# Patient Record
Sex: Male | Born: 1990 | Smoking: Current every day smoker
Health system: Southern US, Community
[De-identification: ages and names within clinical notes are randomized; demographics above are authoritative.]

## PROBLEM LIST (undated history)

## (undated) DIAGNOSIS — Z789 Other specified health status: Secondary | ICD-10-CM

---

## 2020-12-09 ENCOUNTER — Other Ambulatory Visit: Payer: Self-pay

## 2020-12-09 ENCOUNTER — Emergency Department (HOSPITAL_COMMUNITY): Payer: No Typology Code available for payment source

## 2020-12-09 ENCOUNTER — Emergency Department (HOSPITAL_COMMUNITY)
Admission: EM | Admit: 2020-12-09 | Discharge: 2020-12-10 | Disposition: A | Discharge status: Home / Self Care | Payer: No Typology Code available for payment source | Attending: Emergency Medicine | Admitting: Emergency Medicine

## 2020-12-09 ENCOUNTER — Encounter (HOSPITAL_COMMUNITY): Payer: Self-pay

## 2020-12-09 DIAGNOSIS — Z5321 Procedure and treatment not carried out due to patient leaving prior to being seen by health care provider: Secondary | ICD-10-CM | POA: Diagnosis not present

## 2020-12-09 DIAGNOSIS — M25511 Pain in right shoulder: Secondary | ICD-10-CM | POA: Insufficient documentation

## 2020-12-09 DIAGNOSIS — M542 Cervicalgia: Secondary | ICD-10-CM | POA: Diagnosis not present

## 2020-12-09 DIAGNOSIS — Y92410 Unspecified street and highway as the place of occurrence of the external cause: Secondary | ICD-10-CM | POA: Insufficient documentation

## 2020-12-09 DIAGNOSIS — H5789 Other specified disorders of eye and adnexa: Secondary | ICD-10-CM | POA: Insufficient documentation

## 2020-12-09 HISTORY — DX: Other specified health status: Z78.9
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
## 2021-10-01 DIAGNOSIS — Z202 Contact with and (suspected) exposure to infections with a predominantly sexual mode of transmission: Secondary | ICD-10-CM

## 2021-10-02 ENCOUNTER — Inpatient Hospital Stay
Admit: 2021-10-02 | Discharge: 2021-10-02 | Disposition: A | Discharge status: Home / Self Care | Attending: Emergency Medicine

## 2021-10-02 DIAGNOSIS — Z7689 Persons encountering health services in other specified circumstances: Secondary | ICD-10-CM

## 2021-10-02 MED ORDER — AZITHROMYCIN 250 MG TAB
250 mg | ORAL | Status: AC
Start: 2021-10-02 — End: 2021-10-02
  Administered 2021-10-02: 09:00:00 via ORAL

## 2021-10-02 MED ORDER — METRONIDAZOLE 250 MG TAB
250 mg | ORAL | Status: AC
Start: 2021-10-02 — End: 2021-10-02
  Administered 2021-10-02: 09:00:00 via ORAL

## 2021-10-02 MED ORDER — CEFTRIAXONE 500 MG SOLUTION FOR INJECTION
500 mg | Freq: Once | INTRAMUSCULAR | Status: AC
Start: 2021-10-02 — End: 2021-10-02
  Administered 2021-10-02: 09:00:00 via INTRAMUSCULAR

## 2021-10-02 MED FILL — CEFTRIAXONE 500 MG SOLUTION FOR INJECTION: 500 mg | INTRAMUSCULAR | Qty: 500

## 2021-10-02 MED FILL — AZITHROMYCIN 250 MG TAB: 250 mg | ORAL | Qty: 4

## 2021-10-02 MED FILL — METRONIDAZOLE 250 MG TAB: 250 mg | ORAL | Qty: 8

## 2021-10-02 NOTE — ED Provider Notes (Signed)
EMERGENCY DEPARTMENT HISTORY AND PHYSICAL EXAM    Date: 10/02/2021  Patient Name: Dustin Kirk    History of Presenting Illness     Chief Complaint   Patient presents with    Exposure to STD         History Provided By: Primary patient    Additional History (Context):   3:54 AM  Dustin Kirk is a 31 y.o. male with PMHX of no medical history who presents to the emergency department C/O STD exposure.  He presents asking for treatment for STDs after his male partner states she has chlamydia and gonorrhea.  Patient is completely asymptomatic without lesions or dysuria.  He just got the notice today.    Social History  Patient does smoke      Family History  No immunocompromise      PCP: None        Past History     Past Medical History:  No past medical history on file.    Past Surgical History:  No past surgical history on file.    Family History:  No family history on file.    Social History:       Allergies:  No Known Allergies      Review of Systems   Review of Systems  He is completely asymptomatic    Physical Exam     Vitals:    10/02/21 0019 10/02/21 0021 10/02/21 0355 10/02/21 0438   BP: 126/75  121/83 137/88   Pulse: 75  84 81   Resp: 18  17 16    Temp: 98.1 ??F (36.7 ??C)      SpO2: 98%  100%    Weight:  65.8 kg (145 lb)       Physical Exam  Vitals and nursing note reviewed.   Constitutional:       General: He is not in acute distress.     Appearance: He is well-developed. He is not diaphoretic.   HENT:      Head: Normocephalic and atraumatic.   Eyes:      General: No scleral icterus.     Extraocular Movements:      Right eye: Normal extraocular motion.      Left eye: Normal extraocular motion.      Conjunctiva/sclera: Conjunctivae normal.      Pupils: Pupils are equal, round, and reactive to light.   Neck:      Trachea: No tracheal deviation.   Cardiovascular:      Rate and Rhythm: Normal rate.   Pulmonary:      Effort: Pulmonary effort is normal. No respiratory distress.      Breath sounds: No  stridor.   Genitourinary:     Comments: Deferred per patient request  Musculoskeletal:         General: No tenderness. Normal range of motion.      Cervical back: Normal range of motion and neck supple.      Comments: Grossly unremarkable without abnormalities   Skin:     General: Skin is warm and dry.      Capillary Refill: Capillary refill takes less than 2 seconds.      Findings: No erythema or rash.   Neurological:      Mental Status: He is alert and oriented to person, place, and time.      GCS: GCS eye subscore is 4. GCS verbal subscore is 5. GCS motor subscore is 6.      Cranial Nerves: No  cranial nerve deficit.      Motor: No weakness.   Psychiatric:         Attention and Perception: Attention normal.         Mood and Affect: Mood normal.         Speech: Speech normal.         Behavior: Behavior normal.         Thought Content: Thought content normal.         Judgment: Judgment normal.     Diagnostic Study Results     Labs -  No results found for this or any previous visit (from the past 24 hour(s)).     Radiologic Studies -   No orders to display     CT Results  (Last 48 hours)      None          CXR Results  (Last 48 hours)      None            Medications given in the ED-  Medications   cefTRIAXone (ROCEPHIN) 500 mg in lidocaine (PF) (XYLOCAINE) 10 mg/mL (1 %) IM injection (500 mg IntraMUSCular Given 10/02/21 0423)   azithromycin (ZITHROMAX) tablet 1,000 mg (1,000 mg Oral Given 10/02/21 0422)   metroNIDAZOLE (FLAGYL) tablet 2,000 mg (2,000 mg Oral Given 10/02/21 0422)         Medical Decision Making   I am the first provider for this patient.    I reviewed the vital signs, available nursing notes, past medical history, past surgical history, family history and social history.    Vital Signs-Reviewed the patient's vital signs.    Pulse Oximetry Analysis - 100% on room air    Records Reviewed: NURSING NOTES AND PREVIOUS MEDICAL RECORDS    Provider Notes (Medical Decision Making):   We discussed treatment and he  does not want testing he only wants antibiotics for his infection.  We recommended outpatient referral to test for HIV syphilis etc.  I offered to examine his skin carefully however he politely declined.  He will be discharged with health department referral.  It is possible he has insidious underlying HIV syphilis or other infections and he understands this.    Procedures:  Procedures    ED Course:   4:54 AM: Initial assessment performed. The patients presenting problems have been discussed, and they are in agreement with the care plan formulated and outlined with them.  I have encouraged them to ask questions as they arise throughout their visit.    Diagnosis and Disposition       DISCHARGE NOTE:  4:20 AM  Dustin Kirk's  results have been reviewed with him.  He has been counseled regarding his diagnosis, treatment, and plan.  He verbally conveys understanding and agreement of the signs, symptoms, diagnosis, treatment and prognosis and additionally agrees to follow up as discussed.  He also agrees with the care-plan and conveys that all of his questions have been answered.  I have also provided discharge instructions for him that include: educational information regarding their diagnosis and treatment, and list of reasons why they would want to return to the ED prior to their follow-up appointment, should his condition change. He has been provided with education for proper emergency department utilization.     CLINICAL IMPRESSION:    1. Encounter for assessment of STD exposure        PLAN:  1. D/C Home  2. There are no discharge medications for this  patient.    3.   Follow-up Information       Follow up With Specialties Details Why Contact Info    [facility] HEALTH DEPARTMENT    [address]  [address] [place] [address]  [phone]          _______________________________    This note was partially transcribed via voice recognition software. Although efforts have been made to catch any  discrepancies, it may contain sound alike words, grammatical errors, or nonsensical words.

## 2021-10-02 NOTE — ED Notes (Signed)
Pt in for exposure to STDs.

## 2021-10-02 NOTE — ED Notes (Signed)
Patient presents with above complaint , just want to be treated for std denies any penile discharge

## 2021-10-02 NOTE — ED Notes (Signed)
Patient revaluated after medication given no reaction noted then discharge home. To follow up with pmd. Instructions given

## 2021-11-01 ENCOUNTER — Inpatient Hospital Stay
Admit: 2021-11-01 | Discharge: 2021-11-01 | Disposition: A | Discharge status: Home / Self Care | Attending: Emergency Medicine

## 2021-11-01 DIAGNOSIS — Z202 Contact with and (suspected) exposure to infections with a predominantly sexual mode of transmission: Secondary | ICD-10-CM

## 2021-11-01 LAB — URINALYSIS W/ RFLX MICROSCOPIC
Bilirubin, Urine: NEGATIVE
Bilirubin: NEGATIVE
Blood, Urine: NEGATIVE
Blood: NEGATIVE
Glucose, Ur: NEGATIVE mg/dL
Glucose: NEGATIVE mg/dL
Ketone: NEGATIVE mg/dL
Ketones, Urine: NEGATIVE mg/dL
Leukocyte Esterase, Urine: NEGATIVE
Leukocyte Esterase: NEGATIVE
Nitrite, Urine: NEGATIVE
Nitrites: NEGATIVE
Protein, UA: NEGATIVE mg/dL
Protein: NEGATIVE mg/dL
Specific Gravity, UA: 1.006 (ref 1.005–1.030)
Specific gravity: 1.006 (ref 1.005–1.030)
Urobilinogen, UA, POCT: 0.2 EU/dL (ref 0.2–1.0)
Urobilinogen: 0.2 EU/dL (ref 0.2–1.0)
pH (UA): 6.5 (ref 5.0–8.0)
pH, UA: 6.5 (ref 5.0–8.0)

## 2021-11-01 LAB — CHLAMYDIA/NEISSERIA AMPLIFICATION
Chlamydia amplification: NEGATIVE
N. gonorrhoeae amplification: NEGATIVE

## 2021-11-01 LAB — C.TRACHOMATIS N.GONORRHOEAE DNA
Chlamydia trachomatis, NAA: NEGATIVE
Neisseria Gonorrhoeae, NAA: NEGATIVE

## 2021-11-01 MED ORDER — CEFTRIAXONE 500 MG SOLUTION FOR INJECTION
500 mg | INTRAMUSCULAR | Status: AC
Start: 2021-11-01 — End: 2021-11-01
  Administered 2021-11-01: 13:00:00 via INTRAMUSCULAR

## 2021-11-01 MED ORDER — DOXYCYCLINE HYCLATE 100 MG TAB
100 mg | ORAL_TABLET | Freq: Two times a day (BID) | ORAL | 0 refills | Status: AC
Start: 2021-11-01 — End: 2021-11-11

## 2021-11-01 MED FILL — CEFTRIAXONE 500 MG SOLUTION FOR INJECTION: 500 mg | INTRAMUSCULAR | Qty: 500

## 2021-11-01 NOTE — ED Notes (Signed)
Swabs obtained by provider with this RN as chaperone

## 2021-11-01 NOTE — ED Provider Notes (Signed)
ED Provider Notes by Sheran Fava, MD at 11/01/21 0740                Author: Sheran Fava, MD  Service: EMERGENCY  Author Type: Physician       Filed: 11/01/21 0923  Date of Service: 11/01/21 0740  Status: Signed          Editor: Sheran Fava, MD (Physician)               [place]           Patient Name: Dustin Kirk   MRN: [id number]   Birth Date: [birth year]   Provider: Sheran Fava, MD   PCP: None    Time/Date of evaluation: 7:40 AM 11/01/21           History of Presenting Illness          Chief Complaint       Patient presents with        ?  Exposure to STD           History Provided By: Patient       History Mahalia Longest):    Dustin Kirk is a 31 y.o. male with no contributory PMHx who presents to the emergency department (room 11) by POV C/O exposure to STD onset 4 to 5 days ago. Associated sxs include lesions on penis. Pt denies penile discharge or any other sxs or  complaints.  Patient states that his sexual partner told him that they were positive for chlamydia and gonorrhea as well as trichomonas.  He would like to "get tested and treated for everything."           Past History        Past Medical History:   No past medical history on file.      Past Surgical History:   No past surgical history on file.      Family History:   No family history on file.      Social History:          Medications:         Allergies:   No Known Allergies      Social Determinants of Health:     Social Determinants of Health          Tobacco Use: Not on file     Alcohol Use: Not on file     Financial Resource Strain: Not on file     Food Insecurity: Not on file     Transportation Needs: Not on file     Physical Activity: Not on file     Stress: Not on file     Social Connections: Not on file     Intimate Partner Violence: Not on file     Depression: Not on file       Housing Stability: Not on file             Physical Exam          Vitals:           11/01/21 0721        BP:  124/77     Pulse:  (!) 116     Resp:  18     Temp:  97.5 ??F (36.4 ??C)     SpO2:  97%     Weight:  68 kg (150 lb)        Height:  5' 6" (  1.676 m)           Physical Exam   Vitals and nursing note reviewed. Exam conducted with a chaperone present Royetta Asal,  Therapist, sports).    Constitutional:        General: He is not in acute distress.      Appearance: Normal appearance.    HENT:       Head: Normocephalic and atraumatic.    Eyes:       Extraocular Movements: Extraocular movements intact.       Conjunctiva/sclera: Conjunctivae normal.       Pupils: Pupils are equal, round, and reactive to light.     Cardiovascular:       Rate and Rhythm: Normal rate and regular rhythm.       Heart sounds: No murmur heard.     No friction rub. No gallop.    Pulmonary:       Effort: Pulmonary effort is normal.       Breath sounds: Normal breath sounds. No wheezing, rhonchi or rales.     Abdominal:       General: Abdomen is flat. Bowel sounds are normal.       Palpations: Abdomen is soft.       Tenderness: There is no abdominal tenderness. There is no guarding or rebound.    Genitourinary:      Penis: Circumcised. Lesions present.        Testes: Normal.            Musculoskeletal:           General: Normal range of motion.    Skin:      General: Skin is warm and dry.    Neurological:       General: No focal deficit present.       Mental Status: He is alert and oriented to person, place, and time.    Psychiatric:          Mood and Affect: Mood normal.          Behavior: Behavior normal.            Diagnostic Study Results        Labs:     Recent Results (from the past 12 hour(s))     URINALYSIS W/ RFLX MICROSCOPIC          Collection Time: 11/01/21  7:47 AM         Result  Value  Ref Range            Color  YELLOW          Appearance  CLEAR          Specific gravity  1.006  1.005 - 1.030         pH (UA)  6.5  5.0 - 8.0         Protein  Negative  NEG mg/dL       Glucose  Negative  NEG mg/dL       Ketone  Negative  NEG mg/dL        Bilirubin  Negative  NEG         Blood  Negative  NEG         Urobilinogen  0.2  0.2 - 1.0 EU/dL       Nitrites  Negative  NEG              Leukocyte Esterase  Negative  NEG  Radiologic Studies:      No orders to display          CT Results  (Last 48 hours)             None                    CXR Results  (Last 48 hours)             None                       Procedures        Procedures        ED Course        7:40 AM I Jeanella Craze, MD) am the first provider for this patient. Initial assessment performed. I reviewed the vital signs, available nursing notes, past medical history, past surgical history, family history and social history. The patients presenting  problems have been discussed, and they are in agreement with the care plan formulated and outlined with them.  I have encouraged them to ask questions as they arise throughout their visit.      Records Reviewed: Nursing Notes      Cardiac Monitor:   Rate: 116 bpm   Rhythm: Tachycardic Rhythm      Pulse Oximetry Analysis - 97% on RA      Is this patient to be included in the SEP-1 core measure due to severe sepsis or septic shock? NoExclusion criteria - the patient is NOT to be included for SEP-1 Core Measure due to:  2+ SIRS criteria are not met      MEDICATIONS ADMINISTERED IN THE ED:     Medications       cefTRIAXone (ROCEPHIN) 500 mg in lidocaine (PF) (XYLOCAINE) 10 mg/mL (1 %) IM injection (500 mg IntraMUSCular Given 11/01/21 0755)             ED Course as of 11/01/21 0923       Fri Nov 01, 2021        0922  Patient left the ED without discharge instructions.  Verbally discharged over the phone. [JM]              ED Course User Index   [JM] Fatumata Kashani, Rinaldo Ratel, MD             Medical Decision Making        DDX: Chlamydia, gonorrhea, trichomonas, syphilis, herpes simplex      DISCUSSION:   This appears to be a moderate condition.  This appears to be an acute condition.     31 y.o. male with recent exposure to STD in evaluation of the above  differential diagnosis, consideration was given to the following tests and  treatments.  Urinalysis, chlamydia and gonorrhea, RPR, HSV swab are sent for evaluation this patient's complaints.  RPR and chlamydia/gonorrhea, trichomonas is a need to go to outsourced labs.  Patient was given prophylactic treatment for gonorrhea while  in the ED.  He was given treatment for chlamydia as an outpatient. The decision to perform testing and results were discussed with the patient. I discussed each of these tests and considerations with the patient. The patient agrees with the plan of discharge.   Patient was discharged verbally over the phone.      Additional Considerations:   None      Critical Care Time:       None      [employer],  MD        Diagnosis and Disposition        9:23 AM    DISCHARGE NOTE:   Dustin Kirk's results have been reviewed with him. He has been counseled regarding his diagnosis, treatment, and plan. He verbally conveys understanding and agreement of the signs, symptoms,  diagnosis, treatment and prognosis and additionally agrees to follow up as discussed. He also agrees with the care-plan and conveys that all of his questions have been answered. I have also provided discharge instructions for him that include: educational  information regarding their diagnosis and treatment, and list of reasons why they would want to return to the ED prior to their follow-up appointment, should his condition change. He has been provided with education for proper emergency department utilization.       CLINICAL IMPRESSION:         1.  STD exposure            PLAN:   1. D/C Home   2.      Current Discharge Medication List                 START taking these medications          Details        doxycycline (VIBRA-TABS) 100 mg tablet  Take 1 Tablet by mouth two (2) times a day for 10 days.   Qty: 20 Tablet, Refills: 0   Start date: 11/01/2021, End date: 11/11/2021                      3.      Follow-up Information                   Follow up With  Specialties  Details  Why  [place]    Schedule an appointment as soon as possible for a visit   As soon as possible, For follow up from Emergency Department visit.  [address]   [place]              [facility] EMERGENCY DEPT  Emergency Medicine    As needed; If symptoms worsen  [address]   [place] [address]   [place] Jeanella Craze, MD am the primary clinician of record.        Dragon Disclaimer        Please note that this dictation was completed with Dragon, the Acupuncturist.  Quite often unanticipated grammatical, syntax, homophones, and other interpretive errors are  inadvertently transcribed by the computer software.  Please disregard these errors.  Please excuse any errors that have escaped final proofreading.      Jeanella Craze, MD

## 2021-11-01 NOTE — ED Notes (Signed)
Pt arrived with c/o exposure to multiple STDs 4-5 days ago. Pt states the male just told him yesterday and he is here to be treated. Pt denies any drainage or discharge. Pt endorses some testicular pain. Pt is alert and oriented x4 vital signs are stable.

## 2021-11-01 NOTE — ED Notes (Signed)
Pt verbally discharged by provider over phone

## 2021-11-02 LAB — RPR
RPR: NONREACTIVE
RPR: NONREACTIVE

## 2021-11-04 LAB — CULTURE, HSV W/ TYPING

## 2021-11-04 LAB — T VAGINALIS AMPLIFICATION
T. vaginalis by NAA: NEGATIVE
TRICH VAG BY NAA: NEGATIVE
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
